# Patient Record
Sex: Male | Born: 1990 | Race: White | Hispanic: No | Smoking: Current every day smoker
Health system: Southern US, Community
[De-identification: ages and names within clinical notes are randomized; demographics above are authoritative.]

---

## 2018-02-09 ENCOUNTER — Other Ambulatory Visit: Payer: Self-pay

## 2018-02-09 ENCOUNTER — Emergency Department (HOSPITAL_COMMUNITY): Payer: Self-pay

## 2018-02-09 ENCOUNTER — Emergency Department (HOSPITAL_COMMUNITY)
Admission: EM | Admit: 2018-02-09 | Discharge: 2018-02-09 | Discharge status: Against Medical Advice | Payer: Self-pay | Attending: Emergency Medicine | Admitting: Emergency Medicine

## 2018-02-09 ENCOUNTER — Encounter (HOSPITAL_COMMUNITY): Payer: Self-pay

## 2018-02-09 DIAGNOSIS — N451 Epididymitis: Secondary | ICD-10-CM

## 2018-02-09 DIAGNOSIS — F17200 Nicotine dependence, unspecified, uncomplicated: Secondary | ICD-10-CM | POA: Insufficient documentation

## 2018-02-09 LAB — URINALYSIS, ROUTINE W REFLEX MICROSCOPIC
Bilirubin Urine: NEGATIVE
Glucose, UA: NEGATIVE mg/dL
Ketones, ur: NEGATIVE mg/dL
NITRITE: NEGATIVE
PROTEIN: 30 mg/dL — AB
Specific Gravity, Urine: 1.028 (ref 1.005–1.030)
WBC, UA: >50 WBC/hpf — ABNORMAL HIGH (ref 0–5)
pH: 6 (ref 5.0–8.0)

## 2018-02-09 LAB — CBC WITH DIFFERENTIAL/PLATELET
Abs Immature Granulocytes: 0.1 10*3/uL (ref 0.0–0.1)
Basophils Absolute: 0.1 10*3/uL (ref 0.0–0.1)
Basophils Relative: 0 %
EOS ABS: 0 10*3/uL (ref 0.0–0.7)
EOS PCT: 0 %
HEMATOCRIT: 37.9 % — AB (ref 39.0–52.0)
HEMOGLOBIN: 12.3 g/dL — AB (ref 13.0–17.0)
IMMATURE GRANULOCYTES: 1 %
LYMPHS ABS: 2.5 10*3/uL (ref 0.7–4.0)
LYMPHS PCT: 19 %
MCH: 27.5 pg (ref 26.0–34.0)
MCHC: 32.5 g/dL (ref 30.0–36.0)
MCV: 84.6 fL (ref 78.0–100.0)
MONOS PCT: 7 %
Monocytes Absolute: 1 10*3/uL (ref 0.1–1.0)
NEUTROS PCT: 73 %
Neutro Abs: 9.7 10*3/uL — ABNORMAL HIGH (ref 1.7–7.7)
Platelets: 142 10*3/uL — ABNORMAL LOW (ref 150–400)
RBC: 4.48 MIL/uL (ref 4.22–5.81)
RDW: 14.1 % (ref 11.5–15.5)
WBC: 13.3 10*3/uL — ABNORMAL HIGH (ref 4.0–10.5)

## 2018-02-09 LAB — COMPREHENSIVE METABOLIC PANEL
ALT: 30 U/L (ref 0–44)
AST: 27 U/L (ref 15–41)
Albumin: 3.1 g/dL — ABNORMAL LOW (ref 3.5–5.0)
Alkaline Phosphatase: 73 U/L (ref 38–126)
Anion gap: 13 (ref 5–15)
BUN: 9 mg/dL (ref 6–20)
CHLORIDE: 94 mmol/L — AB (ref 98–111)
CO2: 28 mmol/L (ref 22–32)
CREATININE: 0.75 mg/dL (ref 0.61–1.24)
Calcium: 8.2 mg/dL — ABNORMAL LOW (ref 8.9–10.3)
GFR calc Af Amer: >60 mL/min (ref >60)
GFR calc non Af Amer: >60 mL/min (ref >60)
Glucose, Bld: 101 mg/dL — ABNORMAL HIGH (ref 70–99)
POTASSIUM: 3 mmol/L — AB (ref 3.5–5.1)
SODIUM: 135 mmol/L (ref 135–145)
Total Bilirubin: 0.8 mg/dL (ref 0.3–1.2)
Total Protein: 6.2 g/dL — ABNORMAL LOW (ref 6.5–8.1)

## 2018-02-09 LAB — I-STAT CG4 LACTIC ACID, ED: Lactic Acid, Venous: 0.81 mmol/L (ref 0.5–1.9)

## 2018-02-09 LAB — RAPID HIV SCREEN (HIV 1/2 AB+AG)
HIV 1/2 ANTIBODIES: NONREACTIVE
HIV-1 P24 Antigen - HIV24: NONREACTIVE

## 2018-02-09 MED ORDER — ACETAMINOPHEN 325 MG PO TABS
650.0000 mg | ORAL_TABLET | Freq: Once | ORAL | Status: AC
  Administered 2018-02-09: 650 mg via ORAL
  Filled 2018-02-09: qty 2

## 2018-02-09 MED ORDER — DOXYCYCLINE HYCLATE 100 MG PO TABS
100.0000 mg | ORAL_TABLET | Freq: Once | ORAL | Status: AC
  Administered 2018-02-09: 100 mg via ORAL
  Filled 2018-02-09: qty 1

## 2018-02-09 MED ORDER — SODIUM CHLORIDE 0.9 % IV SOLN
1000.0000 mL | INTRAVENOUS | Status: DC
  Administered 2018-02-09: 1000 mL via INTRAVENOUS

## 2018-02-09 MED ORDER — KETOROLAC TROMETHAMINE 30 MG/ML IJ SOLN
30.0000 mg | Freq: Once | INTRAMUSCULAR | Status: AC
Start: 2018-02-09 — End: 2018-02-09
  Administered 2018-02-09: 30 mg via INTRAVENOUS
  Filled 2018-02-09: qty 1

## 2018-02-09 MED ORDER — HYDROMORPHONE HCL 1 MG/ML IJ SOLN: 0.5000 mg | Freq: Once | INTRAMUSCULAR | Status: DC

## 2018-02-09 MED ORDER — HYDROMORPHONE HCL 1 MG/ML IJ SOLN: 1.0000 mg | Freq: Once | INTRAMUSCULAR | Status: DC

## 2018-02-09 MED ORDER — HYDROMORPHONE HCL 1 MG/ML IJ SOLN
1.0000 mg | Freq: Once | INTRAMUSCULAR | Status: AC
  Administered 2018-02-09: 1 mg via INTRAVENOUS
  Filled 2018-02-09: qty 1

## 2018-02-09 MED ORDER — DOXYCYCLINE HYCLATE 100 MG PO CAPS: 100.0000 mg | ORAL_CAPSULE | Freq: Two times a day (BID) | ORAL | 0 refills | Status: AC

## 2018-02-09 MED ORDER — HYDROMORPHONE HCL 1 MG/ML IJ SOLN
1.0000 mg | Freq: Two times a day (BID) | INTRAMUSCULAR | Status: DC | PRN
  Administered 2018-02-09: 1 mg via INTRAVENOUS
  Filled 2018-02-09 (×2): qty 1

## 2018-02-09 MED ORDER — HYDROMORPHONE HCL 1 MG/ML IJ SOLN: 0.5000 mg | Freq: Once | INTRAMUSCULAR | Status: DC | PRN

## 2018-02-09 MED ORDER — ONDANSETRON HCL 4 MG/2ML IJ SOLN
4.0000 mg | Freq: Once | INTRAMUSCULAR | Status: AC
Start: 2018-02-09 — End: 2018-02-09
  Administered 2018-02-09: 4 mg via INTRAVENOUS
  Filled 2018-02-09: qty 2

## 2018-02-09 MED ORDER — CEFTRIAXONE SODIUM 250 MG IJ SOLR
250.0000 mg | Freq: Once | INTRAMUSCULAR | Status: AC
  Administered 2018-02-09: 250 mg via INTRAMUSCULAR
  Filled 2018-02-09: qty 250

## 2018-02-09 MED ORDER — SODIUM CHLORIDE 0.9 % IV BOLUS (SEPSIS)
1000.0000 mL | Freq: Once | INTRAVENOUS | Status: AC
  Administered 2018-02-09: 1000 mL via INTRAVENOUS

## 2018-02-09 NOTE — ED Notes (Signed)
Attempted to get urine sample from pt. Pt attempted but was unable to urinate. Urinal at bedside.

## 2018-02-09 NOTE — ED Triage Notes (Signed)
Pt states he noticed his testicles were swollen yesterday. He reports this morning they are more swollen. He also reports penile discharge and burning with urination.

## 2018-02-09 NOTE — ED Notes (Signed)
Tech able to locate patient in an effort to wait for discharge instructions and meddication

## 2018-02-09 NOTE — ED Provider Notes (Signed)
Medical screening examination/treatment/procedure(s) were conducted as a shared visit with non-physician practitioner(s) and myself.  I personally evaluated the patient during the encounter.  None Has 2 days of testicular pain.  He reports is gotten very severe in the left testicle.  He does have prior history of chlamydia a few years ago.  Patient is alert but very uncomfortable in appearance.  He is nontoxic.  No respiratory distress.  Abdomen is soft but he is endorsing discomfort in the left lower abdomen and suprapubic area without guarding.  Left testicle is inflamed and exquisitely tender.  He however does not have any crepitus, pain or swelling in the perineum, perirectal area or base of penis.  Pain and swelling are fairly good circumscribed to the left testicle and scrotum.  Findings very suggestive of epididymitis.  Proceed with testicular ultrasound and pain control.  I agree with plan of management.   Arby BarrettePfeiffer, Samuele Storey, MD 02/15/18 1326

## 2018-02-09 NOTE — Discharge Instructions (Addendum)
You were given a prescription for antibiotics. Please take the antibiotic prescription fully.   Please call the urology office to make an appointment for follow up.   Please return to the emergency room for any new or worsening symptoms including any fevers, continued swelling/pain.

## 2018-02-09 NOTE — ED Notes (Signed)
Patient states throbbing pain has returned since moving around. Seen grimmacing

## 2018-02-09 NOTE — ED Provider Notes (Signed)
[facility]   CSN: [id number] Arrival date & time: 02/09/18  1220     History   Chief Complaint Chief Complaint  Patient presents with  . Groin Swelling    HPI Donald Vargas is a 27 y.o. male.  HPI   Patient is a 27 year old male with a history of chlamydia, IVDU, who presents emergency department today complaining of a 2-day history of testicular swelling and pain.  States that his left testicle began swelling 2 days ago and since then pain and swelling have worsened.  Pain radiates from his groin up to his left lower quadrant.  Pain is constant and severe in nature.  Reports associated nausea but no vomiting.  Denies any known fevers at home.  No vomiting, diarrhea.  Prior to the onset of the symptoms he reported penile discharge and some dysuria.  States he had similar discharge when he was diagnosed with chlamydia several years ago.  Denies any known fevers at home.   History reviewed. No pertinent past medical history.  There are no active problems to display for this patient.   History reviewed. No pertinent surgical history.      Home Medications    Prior to Admission medications   Medication Sig Start Date End Date Taking? Authorizing Provider  doxycycline (VIBRAMYCIN) 100 MG capsule Take 1 capsule (100 mg total) by mouth 2 (two) times daily for 10 days. 02/09/18 02/19/18  Sarah-Jane Nazario S, PA-C    Family History History reviewed. No pertinent family history.  Social History Social History   Tobacco Use  . Smoking status: Current Every Day Smoker  . Smokeless tobacco: Never Used  Substance Use Topics  . Alcohol use: Not Currently  . 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   Review of Systems Review of Systems  Constitutional: Negative for fever.  HENT: Negative for congestion.   Eyes: Negative for visual  disturbance.  Respiratory: Negative for shortness of breath.   Cardiovascular: Negative for chest pain.  Gastrointestinal: Positive for abdominal pain and nausea. Negative for constipation, diarrhea and vomiting.  Genitourinary: Positive for discharge, dysuria, scrotal swelling and testicular pain. Negative for frequency, genital sores and penile swelling.  Musculoskeletal: Negative for back pain.  Skin: Positive for color change.  Neurological: Negative for headaches.    Physical Exam Updated Vital Signs BP 116/72   Pulse 94   Temp 98.6 F (37 C) (Oral)   Resp (!) 21   Ht 5\' 7"  (1.702 m)   Wt 56.7 kg (125 lb)   SpO2 100%   BMI 19.58 kg/m   Physical Exam  Constitutional: He appears well-developed and well-nourished. He appears distressed.  HENT:  Head: Normocephalic and atraumatic.  Eyes: Conjunctivae are normal. No scleral icterus.  Neck: Neck supple.  Cardiovascular: Normal rate, regular rhythm and normal heart sounds.  No murmur heard. Pulmonary/Chest: Effort normal and breath sounds normal. No respiratory distress.  Abdominal: Soft. Bowel sounds are normal. There is tenderness (LLQ, left groin).  Genitourinary:  Genitourinary Comments: Chaperone present during exam. Left testicle is erythematous and very TTP. No obvious crepitance noted, though pt will not allow me to complete this part of exam fully due to pain. Penis circumsized. Small lesion/abrasion noted distally. White penile discharge noted.   Musculoskeletal: He exhibits no edema.  Neurological: He is alert.  Skin:  Skin is warm and dry.  Psychiatric:  Anxious, tearful  Nursing note and vitals reviewed.   ED Treatments / Results  Labs (all labs ordered are listed, but only abnormal results are displayed) Labs Reviewed  URINALYSIS, ROUTINE W REFLEX MICROSCOPIC - Abnormal; Notable for the following components:      Result Value   Color, Urine AMBER (*)    APPearance CLOUDY (*)    Hgb urine dipstick MODERATE  (*)    Protein, ur 30 (*)    Leukocytes, UA SMALL (*)    WBC, UA >50 (*)    Bacteria, UA FEW (*)    Non Squamous Epithelial 0-5 (*)    All other components within normal limits  COMPREHENSIVE METABOLIC PANEL - Abnormal; Notable for the following components:   Potassium 3.0 (*)    Chloride 94 (*)    Glucose, Bld 101 (*)    Calcium 8.2 (*)    Total Protein 6.2 (*)    Albumin 3.1 (*)    All other components within normal limits  CBC WITH DIFFERENTIAL/PLATELET - Abnormal; Notable for the following components:   WBC 13.3 (*)    Hemoglobin 12.3 (*)    HCT 37.9 (*)    Platelets 142 (*)    Neutro Abs 9.7 (*)    All other components within normal limits  URINE CULTURE  RAPID HIV SCREEN (HIV 1/2 AB+AG)  RPR  I-STAT CG4 LACTIC ACID, ED  I-STAT CG4 LACTIC ACID, ED  GC/CHLAMYDIA PROBE AMP ([facility]) NOT AT [facility]    EKG None  Radiology US Scrotum W/doppler  Result Date: 02/09/2018 CLINICAL DATA:  Left scrotal pain 2 days with swelling EXAM: SCROTAL ULTRASOUND DOPPLER ULTRASOUND OF THE TESTICLES TECHNIQUE: Complete ultrasound examination of the testicles, epididymis, and other scrotal structures was performed. Color and spectral Doppler ultrasound were also utilized to evaluate blood flow to the testicles. COMPARISON:  None. FINDINGS: Right testicle Measurements: 3.7 x 2.2 x 2.6 cm. No mass or microlithiasis visualized. Left testicle Measurements: 3.3 x 2.8 x 2.6 cm. No mass or microlithiasis visualized. Right epididymis:  Normal in size and appearance. Left epididymis:  Enlarged hypervascular left epididymis. Hydrocele:  Small left hydrocele. Varicocele:  None visualized. Pulsed Doppler interrogation of both testes demonstrates normal low resistance arterial and venous waveforms bilaterally. IMPRESSION: Negative for testicular mass or torsion Enlarged and hypervascular left epididymis with complex hydrocele on the left. Findings suggest epididymitis. Electronically Signed   By: Marlan Palau  M.D.   On: 02/09/2018 15:06    Procedures Procedures (including critical care time)  Medications Ordered in ED Medications  ondansetron (ZOFRAN) injection 4 mg (4 mg Intravenous Given 02/09/18 1404)  HYDROmorphone (DILAUDID) injection 1 mg (1 mg Intravenous Given 02/09/18 1404)  sodium chloride 0.9 % bolus 1,000 mL (0 mLs Intravenous Stopping Infusion hung by another clincian 02/09/18 1520)  ketorolac (TORADOL) 30 MG/ML injection 30 mg (30 mg Intravenous Given 02/09/18 1404)  HYDROmorphone (DILAUDID) injection 1 mg (1 mg Intravenous Given 02/09/18 1445)  acetaminophen (TYLENOL) tablet 650 mg (650 mg Oral Given 02/09/18 1550)  cefTRIAXone (ROCEPHIN) injection 250 mg (250 mg Intramuscular Given 02/09/18 1657)  doxycycline (VIBRA-TABS) tablet 100 mg (100 mg Oral Given 02/09/18 1657)     Initial Impression / Assessment and Plan / ED Course  I have reviewed the triage vital signs and the nursing notes.  Pertinent labs & imaging results that were available during my care of the patient were reviewed by me and considered in my medical  decision making (see chart for details).     Final Clinical Impressions(s) / ED Diagnoses   Final diagnoses:  Epididymitis   Pt presenting with penile discharge and scrotal swelling/pain/erythema. Temp elevated to 100.1F and pt mildly tachycardic, otherwise VSS. No hypotension. VS improved after administration of fluids and tylenol.   No obvious crepitus on exam, no tracking of erythema to perirectal area. Lower concern for fournier's gangreen given lack of risk factors. Labs and imaging reviewed. Pt does not appear to be septic. He has a mild leukocytosis. No fever. Mild hypokalemia. Lactic acid was negative. UA with leukocytes, no nitrites. Suspect leukocytes present due to underlying STD rather than UTI. Culture sent. Gc/chlamydia, RPR, HIV sent. Scrotal US shows changes consistent with epididymis.  Presentation non-concerning for testicular torsion or  prostatitis.  He was given ceftriaxone IM and doxycycline in the ED to tx epididymitis. Pt requesting to be d/c'ed and almost eloped from the ED. He was given a course of doxycycline and advised to rest and ice the area. Advised him to f/u with urology.   Patient is hemodynamically stable and in no acute distress prior to discharge. Patient is agreeable to plan and will followup with urology if symptoms persist.    ED Discharge Orders        Ordered    doxycycline (VIBRAMYCIN) 100 MG capsule  2 times daily     02/09/18 [address] Alizey Noren S, PA-C 02/09/18 2338    Arby Barrette, MD 02/15/18 1445

## 2018-02-09 NOTE — ED Notes (Signed)
Patient notified tech that he was ready to leave. Stated that his girlfriend left and he needed to know where she was. EDP aware. Patient pulled out IV.

## 2018-02-10 LAB — URINE CULTURE: Culture: NO GROWTH

## 2018-02-10 LAB — RPR: RPR Ser Ql: NONREACTIVE

## 2018-02-10 LAB — GC/CHLAMYDIA PROBE AMP (~~LOC~~) NOT AT ARMC
CHLAMYDIA, DNA PROBE: NEGATIVE
NEISSERIA GONORRHEA: POSITIVE — AB

## 2018-09-20 IMAGING — US US SCROTUM W/ DOPPLER COMPLETE
1 series · 14 of 25 positions shown · non-contrast
Comparison: None.

CLINICAL DATA: Left scrotal pain 2 days with swelling

EXAM:
SCROTAL ULTRASOUND
DOPPLER ULTRASOUND OF THE TESTICLES
TECHNIQUE: Complete ultrasound examination of the testicles, epididymis, and
other scrotal structures was performed. Color and spectral Doppler
ultrasound were also utilized to evaluate blood flow to the
testicles.

[Series 1: us scrotum w/ doppler complete · 0.07mm/px · 14 of 50 slices shown]
[im 1/50]
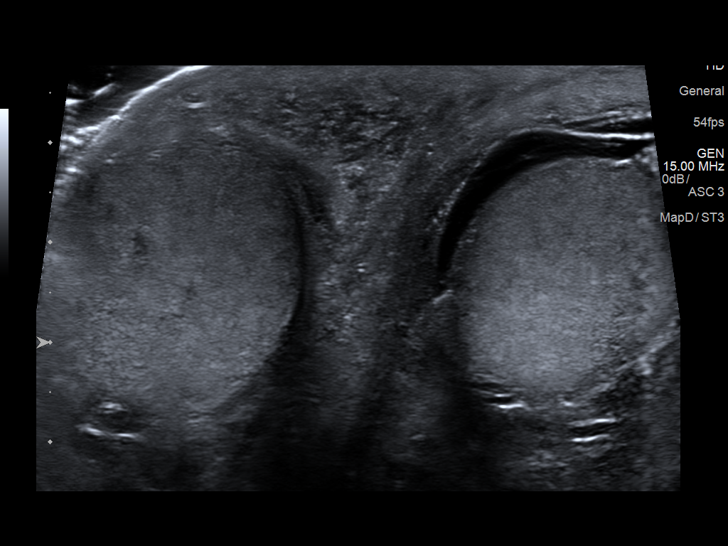
[im 5/50]
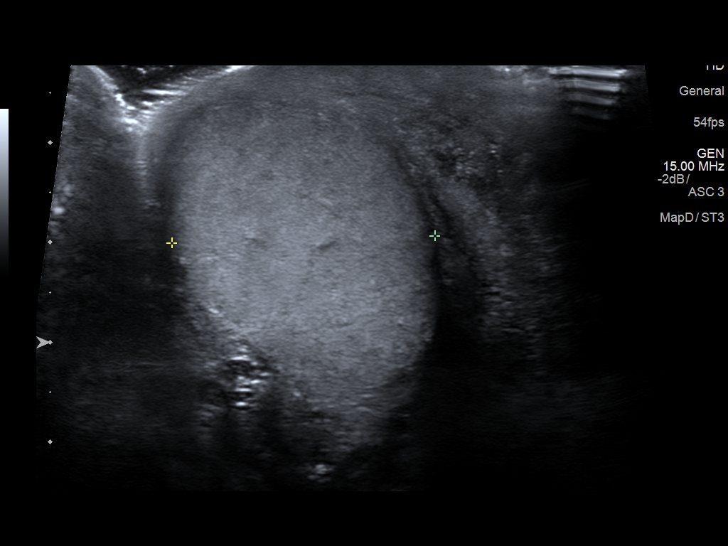
[im 9/50]
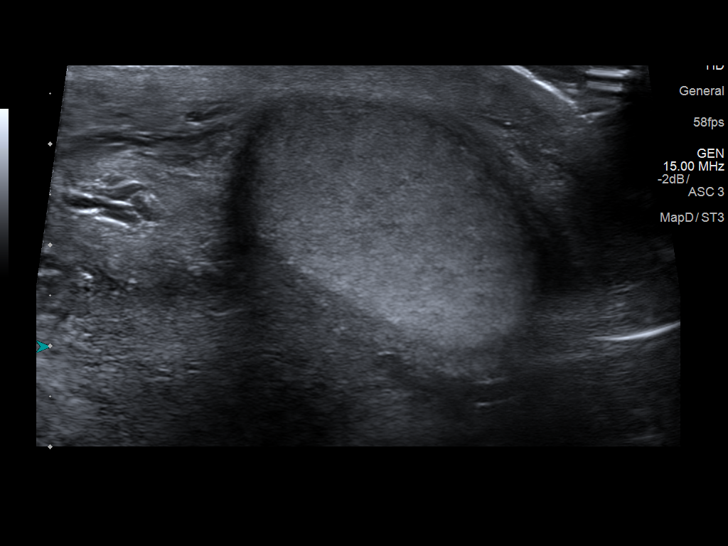
[im 13/50]
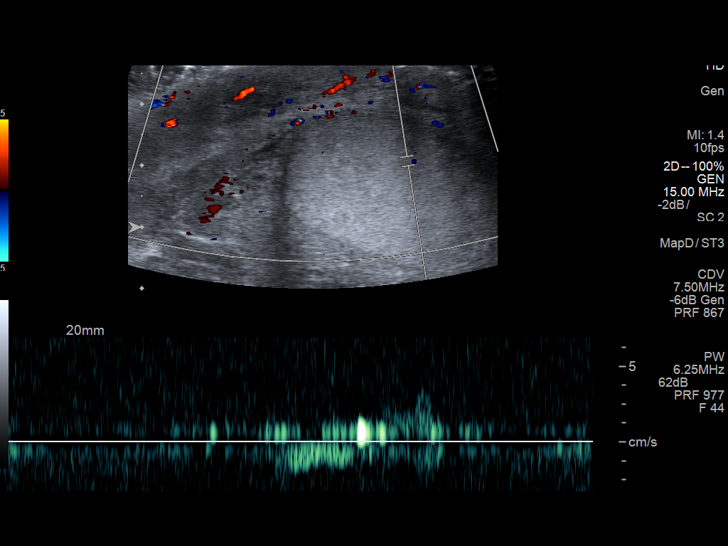
[im 17/50]
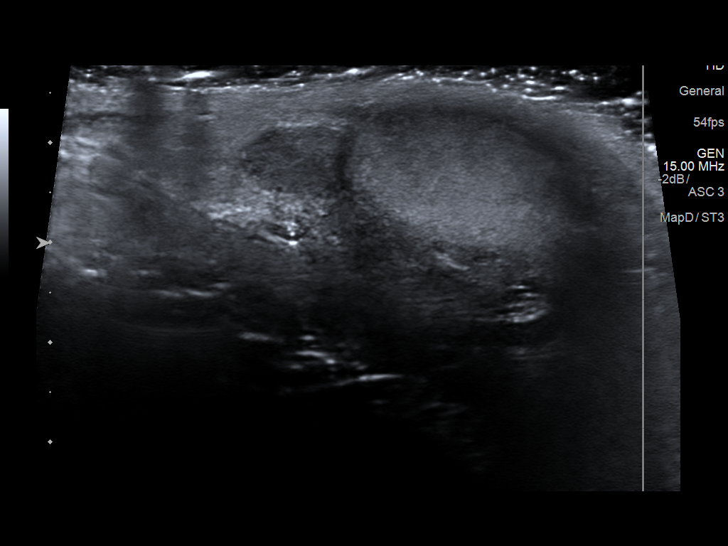
[im 19/50]
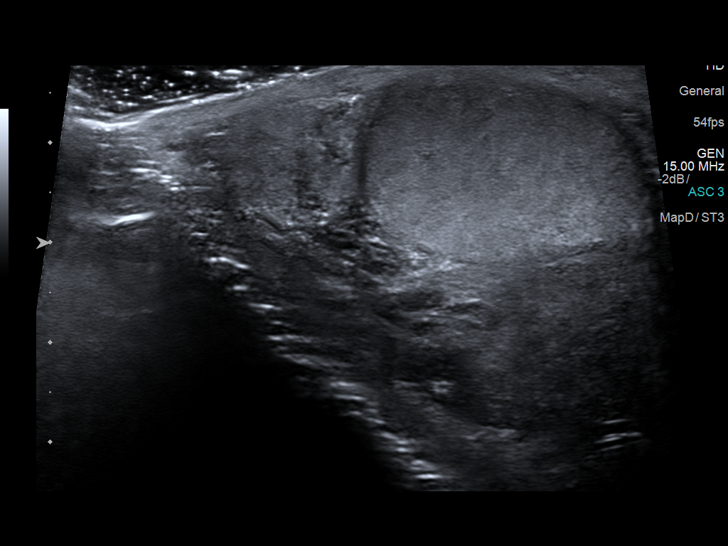
[im 23/50]
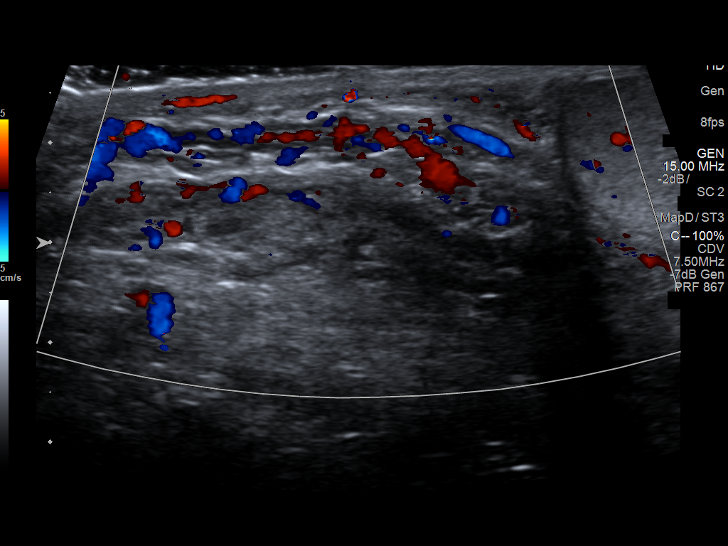
[im 27/50]
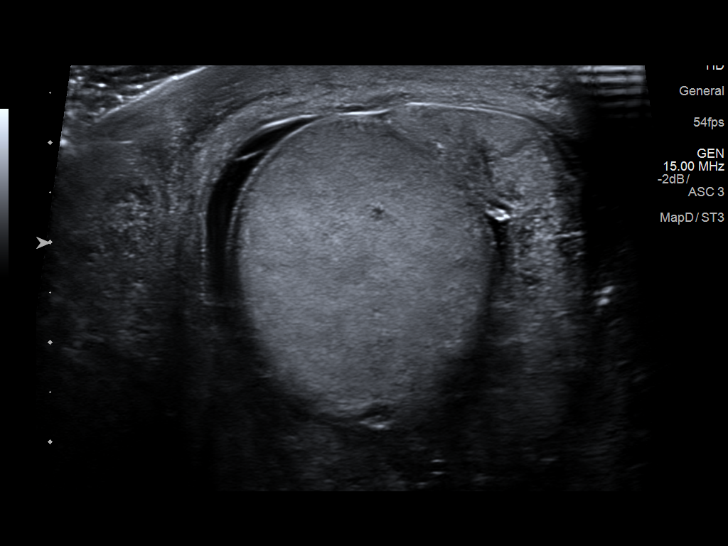
[im 31/50]
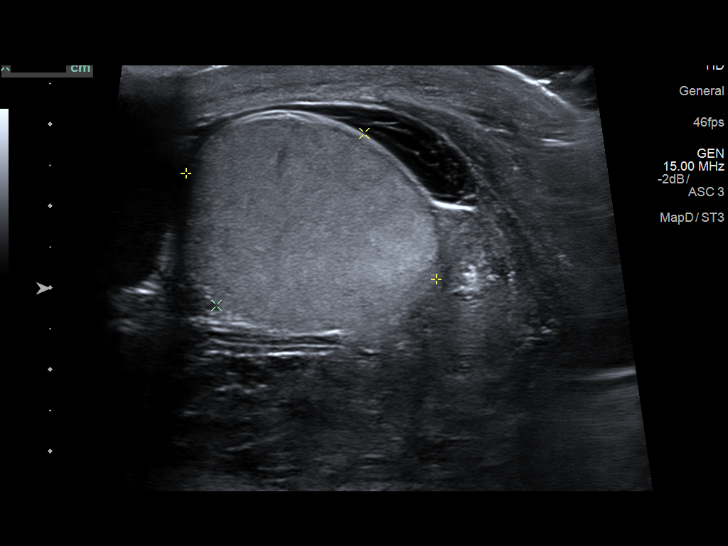
[im 33/50]
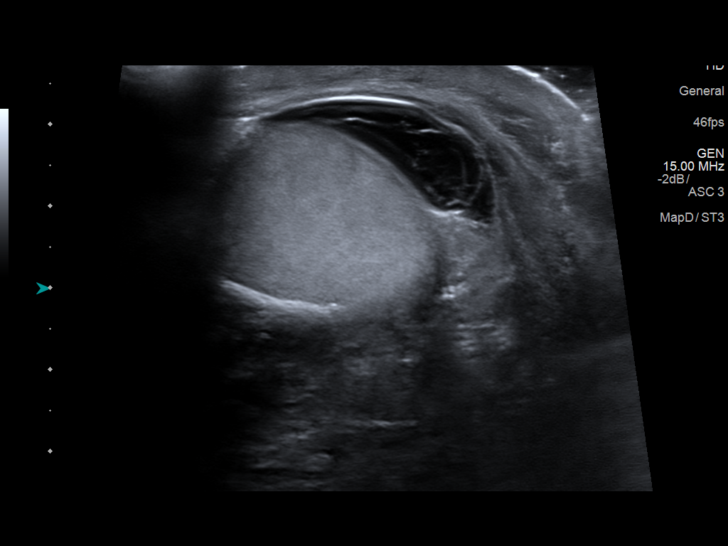
[im 37/50]
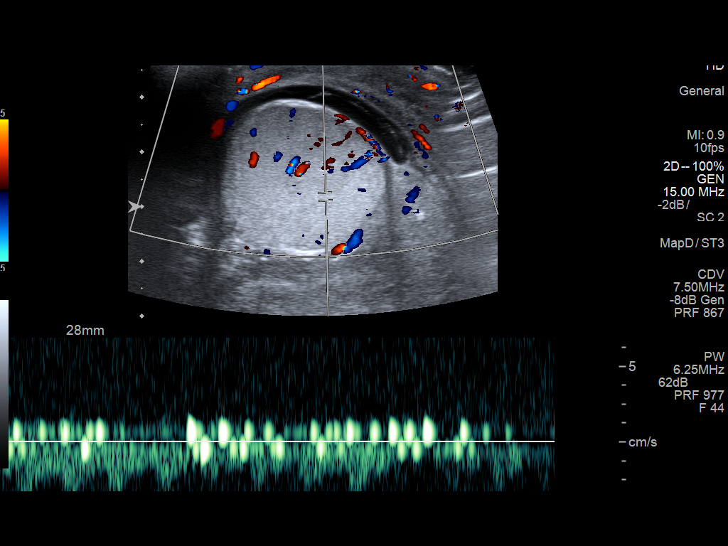
[im 41/50]
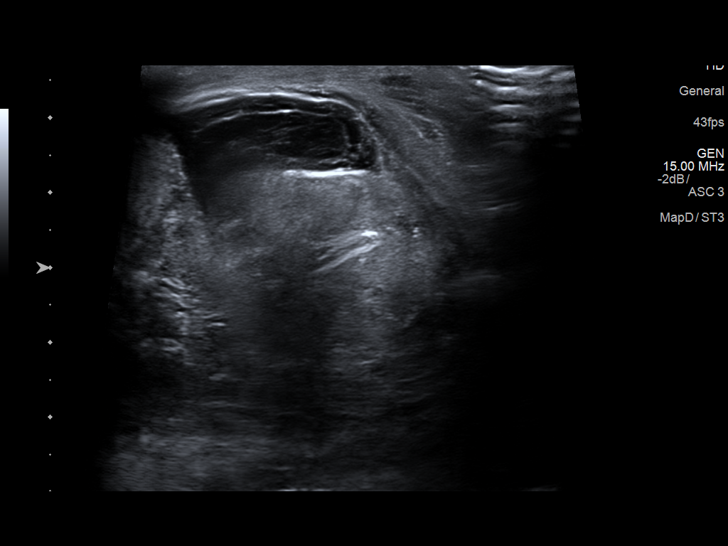
[im 45/50]
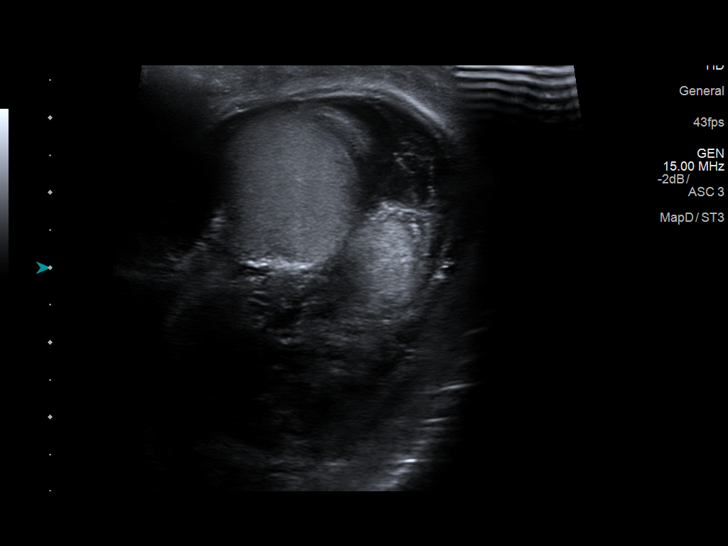
[im 50/50]
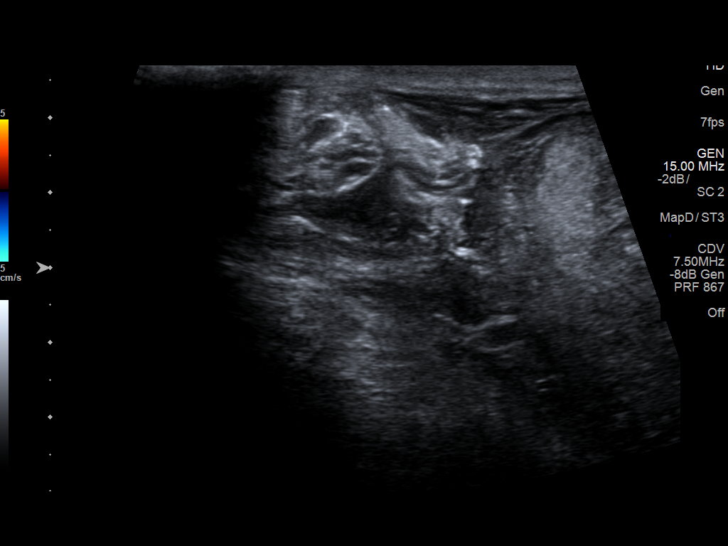

[14 of 25 positions shown; findings below may reference images not displayed]

FINDINGS: Right testicle

Measurements: 3.7 x 2.2 x 2.6 cm. No mass or microlithiasis
visualized.

Left testicle

Measurements: 3.3 x 2.8 x 2.6 cm. No mass or microlithiasis
visualized.

Right epididymis:  Normal in size and appearance.

Left epididymis:  Enlarged hypervascular left epididymis.

Hydrocele:  Small left hydrocele.

Varicocele:  None visualized.

Pulsed Doppler interrogation of both testes demonstrates normal low
resistance arterial and venous waveforms bilaterally.
IMPRESSION: Negative for testicular mass or torsion

Enlarged and hypervascular left epididymis with complex hydrocele on
the left. Findings suggest epididymitis.
